# Patient Record
Sex: Male | Born: 1985 | Race: Black or African American | Hispanic: No | Marital: Married | Smoking: Current every day smoker
Health system: Southern US, Community
[De-identification: ages and names within clinical notes are randomized; demographics above are authoritative.]

---

## 2020-05-06 ENCOUNTER — Encounter (HOSPITAL_COMMUNITY): Payer: Self-pay

## 2020-05-06 ENCOUNTER — Other Ambulatory Visit: Payer: Self-pay

## 2020-05-06 ENCOUNTER — Emergency Department (HOSPITAL_COMMUNITY): Payer: Self-pay

## 2020-05-06 DIAGNOSIS — R0602 Shortness of breath: Secondary | ICD-10-CM | POA: Insufficient documentation

## 2020-05-06 DIAGNOSIS — Z20822 Contact with and (suspected) exposure to covid-19: Secondary | ICD-10-CM | POA: Insufficient documentation

## 2020-05-06 DIAGNOSIS — R079 Chest pain, unspecified: Secondary | ICD-10-CM | POA: Insufficient documentation

## 2020-05-06 DIAGNOSIS — R05 Cough: Secondary | ICD-10-CM | POA: Insufficient documentation

## 2020-05-06 MED ORDER — SODIUM CHLORIDE 0.9% FLUSH: 3.0000 mL | Freq: Once | INTRAVENOUS | Status: DC

## 2020-05-06 NOTE — ED Triage Notes (Addendum)
Pt reports R sided chest pain with deep insipration, runny nose, joint pain, fatigue and a sore in his mouth that started yesterday. States that he has not been feeling himself. Pt reports that he works around a lot of silica dust and also recently started vaping. A&ox4. Ambulatory,

## 2020-05-07 ENCOUNTER — Emergency Department (HOSPITAL_COMMUNITY)
Admission: EM | Admit: 2020-05-07 | Discharge: 2020-05-07 | Disposition: A | Discharge status: Home / Self Care | Payer: Self-pay | Attending: Emergency Medicine | Admitting: Emergency Medicine

## 2020-05-07 DIAGNOSIS — R079 Chest pain, unspecified: Secondary | ICD-10-CM

## 2020-05-07 LAB — BASIC METABOLIC PANEL
Anion gap: 10 (ref 5–15)
BUN: 13 mg/dL (ref 6–20)
CO2: 25 mmol/L (ref 22–32)
Calcium: 9.1 mg/dL (ref 8.9–10.3)
Chloride: 107 mmol/L (ref 98–111)
Creatinine, Ser: 0.78 mg/dL (ref 0.61–1.24)
GFR calc Af Amer: >60 mL/min (ref >60)
GFR calc non Af Amer: >60 mL/min (ref >60)
Glucose, Bld: 115 mg/dL — ABNORMAL HIGH (ref 70–99)
Potassium: 3.9 mmol/L (ref 3.5–5.1)
Sodium: 142 mmol/L (ref 135–145)

## 2020-05-07 LAB — CBC
HCT: 43.2 % (ref 39.0–52.0)
Hemoglobin: 14.3 g/dL (ref 13.0–17.0)
MCH: 29.9 pg (ref 26.0–34.0)
MCHC: 33.1 g/dL (ref 30.0–36.0)
MCV: 90.2 fL (ref 80.0–100.0)
Platelets: 173 10*3/uL (ref 150–400)
RBC: 4.79 MIL/uL (ref 4.22–5.81)
RDW: 14.1 % (ref 11.5–15.5)
WBC: 11.5 10*3/uL — ABNORMAL HIGH (ref 4.0–10.5)
nRBC: 0 % (ref 0.0–0.2)

## 2020-05-07 LAB — SARS CORONAVIRUS 2 BY RT PCR (HOSPITAL ORDER, PERFORMED IN ~~LOC~~ HOSPITAL LAB): SARS Coronavirus 2: NEGATIVE

## 2020-05-07 LAB — TROPONIN I (HIGH SENSITIVITY): Troponin I (High Sensitivity): 3 ng/L (ref <18)

## 2020-05-07 MED ORDER — IPRATROPIUM-ALBUTEROL 0.5-2.5 (3) MG/3ML IN SOLN
3.0000 mL | Freq: Once | RESPIRATORY_TRACT | Status: AC
  Administered 2020-05-07: 3 mL via RESPIRATORY_TRACT
  Filled 2020-05-07: qty 3

## 2020-05-07 MED ORDER — ALBUTEROL SULFATE HFA 108 (90 BASE) MCG/ACT IN AERS
2.0000 | INHALATION_SPRAY | Freq: Once | RESPIRATORY_TRACT | Status: AC
  Administered 2020-05-07: 2 via RESPIRATORY_TRACT
  Filled 2020-05-07: qty 6.7

## 2020-05-07 NOTE — Discharge Instructions (Addendum)
You are seen today for chest pain.  I want you to use the albuterol, 2 puffs once in the morning and once at night for the next week.  I want you to follow-up with your primary care if this does not resolve the next couple of days.  Please wear a mask at work.  I also want you to stop vaping and using tobacco products.  Come back to the emergency department if you have any new or worsening concerning symptoms. Use the attached guides.

## 2020-05-07 NOTE — ED Provider Notes (Signed)
[facility]-EMERGENCY DEPT Provider Note   CSN: [id number] Arrival date & time: 05/06/20  2300     History Chief Complaint  Patient presents with  . Chest Pain    Jared [name] is a 34 y.o. male with no significant past medical history who presents the emergency department with a chief complaint of chest pain.  The patient reports that he developed right-sided chest pain, characterized as pressure and tightness, that has been constant for the last 2 days and is accompanied by dyspnea on exertion, cough, fatigue, rhinorrhea, itchy, watery eyes.  Chest pain is pleuritic.  The patient states that he also noticed a sore to the inside of his left cheek yesterday.  His cough is nonproductive and he notes that it is worse while he is working.  Reports that he began a new job approximately 3 weeks ago where he is exposed to a large amount of silica dust.  He has not supplied with a mask at his job.    He denies fever, chills, abdominal pain, nausea, vomiting, diarrhea, palpitations, leg swelling, back pain, flank pain, dizziness, lightheadedness, headache.  He also notes that he recently began vaping tobacco.  He denies any other illicit or recreational substance use.  No recent travel.  Personal or familial history of PE.  No recent falls or injuries.  He has not been vaccinated for COVID-19.  No sick contacts.  No history of asthma, but he does report a family history of asthma.  The history is provided by the patient. No language interpreter was used.       History reviewed. No pertinent past medical history.  There are no problems to display for this patient.    History reviewed. No pertinent family history.  Social History   Tobacco Use  . Smoking status: Not on file  Substance Use Topics  . Alcohol use: Not on file  . Drug use: Not on file    Home Medications Prior to Admission medications   Not on File    Allergies    Patient has no known  allergies.  Review of Systems   Review of Systems  Constitutional: Negative for appetite change, chills, diaphoresis and fever.  HENT: Positive for rhinorrhea. Negative for congestion, ear discharge, ear pain, facial swelling, sinus pressure, sore throat and voice change.   Eyes: Positive for discharge and itching. Negative for visual disturbance.  Respiratory: Positive for cough and shortness of breath. Negative for apnea and chest tightness.   Cardiovascular: Negative for chest pain, palpitations and leg swelling.  Gastrointestinal: Negative for abdominal pain, blood in stool, diarrhea, nausea and vomiting.  Genitourinary: Negative for dysuria and hematuria.  Musculoskeletal: Negative for back pain, gait problem, myalgias, neck pain and neck stiffness.  Skin: Negative for color change and rash.  Allergic/Immunologic: Negative for immunocompromised state.  Neurological: Negative for seizures, syncope, weakness, numbness and headaches.  Psychiatric/Behavioral: Negative for confusion.    Physical Exam Updated Vital Signs BP (!) 144/106   Pulse 71   Temp 97.7 F (36.5 C) (Oral)   Resp 17   Ht 5\' 8"  (1.727 m)   Wt 93.9 kg   SpO2 100%   BMI 31.47 kg/m   Physical Exam Vitals and nursing note reviewed.  Constitutional:      General: He is not in acute distress.    Appearance: He is well-developed. He is not ill-appearing, toxic-appearing or diaphoretic.     Comments: Well-appearing.  No acute distress.  HENT:  Head: Normocephalic.     Nose: Rhinorrhea present. No congestion.     Mouth/Throat:     Mouth: Mucous membranes are moist.     Comments: Small ulcerated lesion noted to the left buccal mucosa.  No pustules, bleeding, or surrounding erythema.  No palatal petechiae.  Posterior oropharynx is unremarkable. Eyes:     General:        Right eye: No discharge.        Left eye: No discharge.     Extraocular Movements: Extraocular movements intact.     Conjunctiva/sclera:  Conjunctivae normal.     Pupils: Pupils are equal, round, and reactive to light.  Cardiovascular:     Rate and Rhythm: Normal rate and regular rhythm.     Pulses: Normal pulses.     Heart sounds: Normal heart sounds. No murmur heard.  No friction rub. No gallop.   Pulmonary:     Effort: Pulmonary effort is normal. No respiratory distress.     Breath sounds: Normal breath sounds. No stridor. No wheezing, rhonchi or rales.     Comments: No reproducible chest pain to the chest wall.  No crepitus.  No step-offs.  Lungs are clear to auscultation bilaterally.  No increased work of breathing including retractions, accessory muscle use, or nasal flaring. Chest:     Chest wall: No tenderness.  Abdominal:     General: There is no distension.     Palpations: Abdomen is soft. There is no mass.     Tenderness: There is no abdominal tenderness. There is no right CVA tenderness, left CVA tenderness, guarding or rebound.     Hernia: No hernia is present.  Musculoskeletal:     Cervical back: Normal range of motion and neck supple.     Right lower leg: No edema.     Left lower leg: No edema.  Skin:    General: Skin is warm and dry.     Capillary Refill: Capillary refill takes less than 2 seconds.     Coloration: Skin is not jaundiced or pale.     Findings: No bruising or erythema.  Neurological:     Mental Status: He is alert.  Psychiatric:        Behavior: Behavior normal.     ED Results / Procedures / Treatments   Labs (all labs ordered are listed, but only abnormal results are displayed) Labs Reviewed  BASIC METABOLIC PANEL - Abnormal; Notable for the following components:      Result Value   Glucose, Bld 115 (*)    All other components within normal limits  CBC - Abnormal; Notable for the following components:   WBC 11.5 (*)    All other components within normal limits  SARS CORONAVIRUS 2 BY RT PCR (HOSPITAL ORDER, PERFORMED IN [facility])  TROPONIN I (HIGH  SENSITIVITY)  TROPONIN I (HIGH SENSITIVITY)    EKG None   Date: 05/07/2020  Rate: 70  Rhythm: normal sinus rhythm  QRS Axis: normal  Intervals: normal  ST/T Wave abnormalities: normal  Conduction Disutrbances: none  Narrative Interpretation:   Old EKG Reviewed: No previous EKG available for comparison.   Radiology DG Chest 2 View  Result Date: 05/06/2020 CLINICAL DATA:  Chest pain. EXAM: CHEST - 2 VIEW COMPARISON:  None. FINDINGS: The heart size and mediastinal contours are within normal limits. Both lungs are clear. The visualized skeletal structures are unremarkable. IMPRESSION: No active cardiopulmonary disease. Electronically Signed   By: Demetrius Revel.D.  On: 05/06/2020 23:46    Procedures Procedures (including critical care time)  Medications Ordered in ED Medications  sodium chloride flush (NS) 0.9 % injection 3 mL (has no administration in time range)  ipratropium-albuterol (DUONEB) 0.5-2.5 (3) MG/3ML nebulizer solution 3 mL (3 mLs Nebulization Given 05/07/20 2297)    ED Course  I have reviewed the triage vital signs and the nursing notes.  Pertinent labs & imaging results that were available during my care of the patient were reviewed by me and considered in my medical decision making (see chart for details).    MDM Rules/Calculators/A&P                          34 year old male with no significant past medical history who recently began vaping tobacco and it began a new job approximately 3 weeks ago with significant silica dust exposure.  He has not been provided with a mask at his job nor has he been wearing his own mask.  He presents with 2 days of right-sided pleuritic chest pain that has been constant accompanied by dyspnea on exertion, nonproductive cough that is worse during his shifts at work, itchy watery eyes, and rhinorrhea.  No constitutional symptoms.  Vital signs are normal.  On exam, he is overall well-appearing and has no evidence of distress.   Lungs are notably clear to auscultation bilaterally and he has no increased work of breathing on my exam.  He does have a small ulcerated lesion noted to the left buccal mucosa.  This could be related to viral URI as he is also having rhinorrhea and itchy watery eyes.  He also has notably not received his COVID-19 test, which I will order here.  Chest x-ray is clear.  EKG with normal sinus rhythm.  Initial troponin was not elevated and given his hear score of 1, repeat troponin is not indicated.  Doubt ACS, esophageal rupture, aortic dissection, or tension pneumothorax.  No electrolyte derangements.  He has a slight leukocytosis, but has had no constitutional symptoms.  Suspect viral etiology versus symptoms related versus reactive airways disease versus silicosis.  We will treat his symptoms with DuoNeb and reassess.  This may need repeated depending on improvement after initial treatment.  We will plan to ambulate the patient on pulse ox in the ER.  We will also order COVID-19 test although less likely given his symptoms and presentation.  Also encouraged tobacco cessation and having the patient wear a mask during all shifts at his job.  Patient care transferred to PA Patel at the end of my shift to re-evaluate patient following albuterol nebulizer. Patient presentation, ED course, and plan of care discussed with review of all pertinent labs and imaging. Please see his/her note for further details regarding further ED course and disposition.     Final Clinical Impression(s) / ED Diagnoses Final diagnoses:  None    Rx / DC Orders ED Discharge Orders    None       Layloni Fahrner A, PA-C 05/07/20 0717    Molpus, Jonny Ruiz, MD 05/07/20 [phone]

## 2020-05-07 NOTE — ED Provider Notes (Signed)
°  Care of the patient was assumed from M. Mcdonald PA-C at 700 see this provider's note for complete history of present illness, review of systems, and physical exam.  Briefly, the patient is a 34 y.o. male who presented to the ED with pleuritic chest pain.   Plan at time of handoff:  Reassess after DuoNeb.  Patient to be discharged.     Physical Exam  BP (!) 144/106    Pulse 71    Temp 97.7 F (36.5 C) (Oral)    Resp 17    Ht 5\' 8"  (1.727 m)    Wt 93.9 kg    SpO2 100%    BMI 31.47 kg/m   Physical Exam Constitutional:      General: He is not in acute distress.    Appearance: Normal appearance. He is not ill-appearing, toxic-appearing or diaphoretic.  Cardiovascular:     Rate and Rhythm: Normal rate and regular rhythm.     Pulses: Normal pulses.  Pulmonary:     Effort: Pulmonary effort is normal. No respiratory distress.     Breath sounds: Normal breath sounds. No stridor. No wheezing, rhonchi or rales.  Musculoskeletal:        General: Normal range of motion.  Skin:    General: Skin is warm and dry.     Capillary Refill: Capillary refill takes less than 2 seconds.  Neurological:     General: No focal deficit present.     Mental Status: He is alert and oriented to person, place, and time.  Psychiatric:        Mood and Affect: Mood normal.        Behavior: Behavior normal.        Thought Content: Thought content normal.     ED Course/Procedures     Procedures  MDM  Patient is a 34 year old male that came in for pleuritic right-sided chest pain.  Also associated cough, rhinorrhea, itchy and watery eyes.  Normal vital signs, no evidence of distress.  Normal lung exam.  Chest x-ray clear.  EKG with normal sinus rhythm, initial troponin was negative.  HEAR score of 1.  PERC negative.  Previous provider did not think this was due to ACS.  This was most likely due to viral etiology versus symptoms of reactive airway disease versus silicosis.  Patient recently started new job,  states that he only has cough at work.  Patient states that he is exposed to large amounts of silica dust.  Does not have access to mask.  This also could be due to patient's recent initiation of vaping.  Covid test negative.  After DuoNeb, patient states that his chest pain feels better.  States that he still has chest pain, will give albuterol once more since patient states that it did make him feel better.  Will send patient home at this.  Discussed that patient should stop vaping.  Also discussed usage of masks at work, patient agreeable at this time.  Patient to follow-up with primary care if this is not resolved in a couple days.  Patient was able to walk in the ER without any difficulties.  Patient agreeable with this plan.  Patient to be discharged.          20, PA-C 05/07/20 07/08/20    [id number], MD 05/08/20 [phone]

## 2020-05-07 NOTE — ED Notes (Signed)
Pt ambulated around department O2 maintaining between 96-100%.

## 2021-09-30 ENCOUNTER — Other Ambulatory Visit: Payer: Self-pay

## 2021-09-30 ENCOUNTER — Emergency Department (HOSPITAL_COMMUNITY)
Admission: EM | Admit: 2021-09-30 | Discharge: 2021-09-30 | Disposition: A | Discharge status: Home / Self Care | Payer: Self-pay | Attending: Emergency Medicine | Admitting: Emergency Medicine

## 2021-09-30 ENCOUNTER — Emergency Department (HOSPITAL_COMMUNITY): Payer: Self-pay

## 2021-09-30 ENCOUNTER — Encounter (HOSPITAL_COMMUNITY): Payer: Self-pay

## 2021-09-30 DIAGNOSIS — S20222A Contusion of left back wall of thorax, initial encounter: Secondary | ICD-10-CM | POA: Insufficient documentation

## 2021-09-30 DIAGNOSIS — R1032 Left lower quadrant pain: Secondary | ICD-10-CM | POA: Insufficient documentation

## 2021-09-30 DIAGNOSIS — R000 Tachycardia, unspecified: Secondary | ICD-10-CM | POA: Insufficient documentation

## 2021-09-30 DIAGNOSIS — F17210 Nicotine dependence, cigarettes, uncomplicated: Secondary | ICD-10-CM | POA: Insufficient documentation

## 2021-09-30 LAB — CBC WITH DIFFERENTIAL/PLATELET
Abs Immature Granulocytes: 0.1 10*3/uL — ABNORMAL HIGH (ref 0.00–0.07)
Basophils Absolute: 0.1 10*3/uL (ref 0.0–0.1)
Basophils Relative: 0 %
Eosinophils Absolute: 0 10*3/uL (ref 0.0–0.5)
Eosinophils Relative: 0 %
HCT: 48.5 % (ref 39.0–52.0)
Hemoglobin: 16 g/dL (ref 13.0–17.0)
Immature Granulocytes: 1 %
Lymphocytes Relative: 8 %
Lymphs Abs: 1.4 10*3/uL (ref 0.7–4.0)
MCH: 29.7 pg (ref 26.0–34.0)
MCHC: 33 g/dL (ref 30.0–36.0)
MCV: 90 fL (ref 80.0–100.0)
Monocytes Absolute: 1 10*3/uL (ref 0.1–1.0)
Monocytes Relative: 6 %
Neutro Abs: 14.9 10*3/uL — ABNORMAL HIGH (ref 1.7–7.7)
Neutrophils Relative %: 85 %
Platelets: 289 10*3/uL (ref 150–400)
RBC: 5.39 MIL/uL (ref 4.22–5.81)
RDW: 14 % (ref 11.5–15.5)
WBC: 17.5 10*3/uL — ABNORMAL HIGH (ref 4.0–10.5)
nRBC: 0 % (ref 0.0–0.2)

## 2021-09-30 LAB — COMPREHENSIVE METABOLIC PANEL
ALT: 17 U/L (ref 0–44)
AST: 26 U/L (ref 15–41)
Albumin: 4.5 g/dL (ref 3.5–5.0)
Alkaline Phosphatase: 114 U/L (ref 38–126)
Anion gap: 8 (ref 5–15)
BUN: 12 mg/dL (ref 6–20)
CO2: 29 mmol/L (ref 22–32)
Calcium: 8.9 mg/dL (ref 8.9–10.3)
Chloride: 104 mmol/L (ref 98–111)
Creatinine, Ser: 0.93 mg/dL (ref 0.61–1.24)
GFR, Estimated: >60 mL/min (ref >60)
Glucose, Bld: 106 mg/dL — ABNORMAL HIGH (ref 70–99)
Potassium: 3.5 mmol/L (ref 3.5–5.1)
Sodium: 141 mmol/L (ref 135–145)
Total Bilirubin: 0.4 mg/dL (ref 0.3–1.2)
Total Protein: 8.4 g/dL — ABNORMAL HIGH (ref 6.5–8.1)

## 2021-09-30 MED ORDER — IOHEXOL 350 MG/ML SOLN
80.0000 mL | Freq: Once | INTRAVENOUS | Status: AC | PRN
  Administered 2021-09-30: 80 mL via INTRAVENOUS

## 2021-09-30 MED ORDER — SODIUM CHLORIDE (PF) 0.9 % IJ SOLN
INTRAMUSCULAR | Status: AC
  Filled 2021-09-30: qty 50

## 2021-09-30 MED ORDER — HYDROCODONE-ACETAMINOPHEN 5-325 MG PO TABS
1.0000 | ORAL_TABLET | Freq: Once | ORAL | Status: AC
  Administered 2021-09-30: 1 via ORAL
  Filled 2021-09-30: qty 1

## 2021-09-30 MED ORDER — CYCLOBENZAPRINE HCL 10 MG PO TABS
10.0000 mg | ORAL_TABLET | Freq: Two times a day (BID) | ORAL | 0 refills | Status: AC | PRN
Start: 2021-09-30 — End: ?
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
## 2021-09-30 NOTE — ED Triage Notes (Signed)
20G IV placed in RAC for CT scan with permission from Keenan Bachelor, RN. Placed back in lobby. Instructed not to leave with IV in place. Pt verbalized understanding.

## 2021-10-12 ENCOUNTER — Other Ambulatory Visit: Payer: Self-pay

## 2021-10-12 ENCOUNTER — Encounter (HOSPITAL_COMMUNITY): Payer: Self-pay

## 2021-10-12 ENCOUNTER — Emergency Department (HOSPITAL_COMMUNITY)
Admission: EM | Admit: 2021-10-12 | Discharge: 2021-10-12 | Disposition: A | Discharge status: Home / Self Care | Payer: Self-pay | Attending: Student | Admitting: Student

## 2021-10-12 ENCOUNTER — Emergency Department (HOSPITAL_COMMUNITY): Payer: Self-pay

## 2021-10-12 DIAGNOSIS — S32019A Unspecified fracture of first lumbar vertebra, initial encounter for closed fracture: Secondary | ICD-10-CM | POA: Insufficient documentation

## 2021-10-12 DIAGNOSIS — S32009A Unspecified fracture of unspecified lumbar vertebra, initial encounter for closed fracture: Secondary | ICD-10-CM

## 2021-10-12 DIAGNOSIS — S2232XA Fracture of one rib, left side, initial encounter for closed fracture: Secondary | ICD-10-CM | POA: Insufficient documentation

## 2021-10-12 DIAGNOSIS — F17210 Nicotine dependence, cigarettes, uncomplicated: Secondary | ICD-10-CM | POA: Insufficient documentation

## 2021-10-12 MED ORDER — LIDOCAINE 5 % EX PTCH: 1.0000 | MEDICATED_PATCH | CUTANEOUS | 0 refills | Status: AC

## 2021-10-12 MED ORDER — HYDROCODONE-ACETAMINOPHEN 5-325 MG PO TABS
1.0000 | ORAL_TABLET | Freq: Once | ORAL | Status: AC
  Administered 2021-10-12: 1 via ORAL
  Filled 2021-10-12: qty 1

## 2021-10-12 MED ORDER — NAPROXEN 500 MG PO TABS: 500.0000 mg | ORAL_TABLET | Freq: Two times a day (BID) | ORAL | 0 refills | Status: AC

## 2021-10-12 NOTE — Progress Notes (Signed)
Orthopedic Tech Progress Note Patient Details:  Jared [name] 1986/08/28 [id number]  Patient ID: [name], male   DOB: Jul 24, 1986, 35 y.o.   MRN: [id number]  Kizzie Fantasia 10/12/2021, 9:20 AM TLSO ordered from [facility]
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
## 2021-10-12 NOTE — ED Notes (Signed)
Ortho tech called for TLSO brace. Informed this brace comes from an outside vendor and it will take about an hr.
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
## 2021-10-12 NOTE — Discharge Instructions (Addendum)
Your x-ray today revealed a fracture of your 11th rib on the left side as well as a fracture in your spine at the level of L1.  This also explains your leg discomfort.  Please follow-up with the orthopedic office attached to these discharge papers as possible.  I have attached a work note for you as well.  I have sent lidocaine patches and naproxen to your pharmacy.  You may take the naproxen in the morning and at night.  As far as the lidocaine patches, you may use up to 3 patches at the same time however you must remember to wear these patches for no more than 12 hours at a time.  This is important.

## 2023-03-22 IMAGING — CR DG LUMBAR SPINE COMPLETE 4+V
5 series · 5 of 5 positions shown · non-contrast
Comparison: CT 09/30/2021

CLINICAL DATA: Back pain after injury 2 weeks ago

EXAM:
LUMBAR SPINE - COMPLETE 4+ VIEW

[t lumbar spine ap]
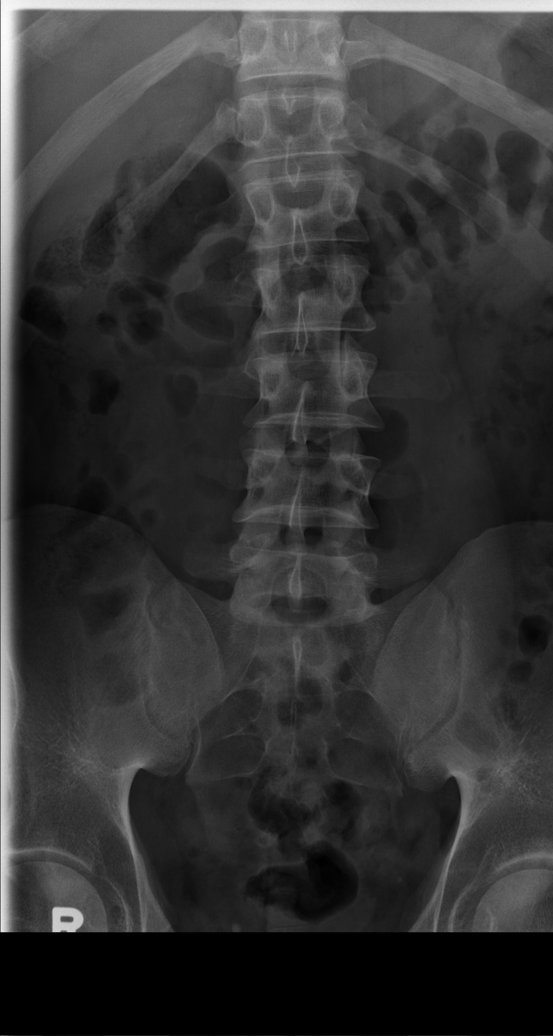

[t lumbar spine obl (1 of 2)]
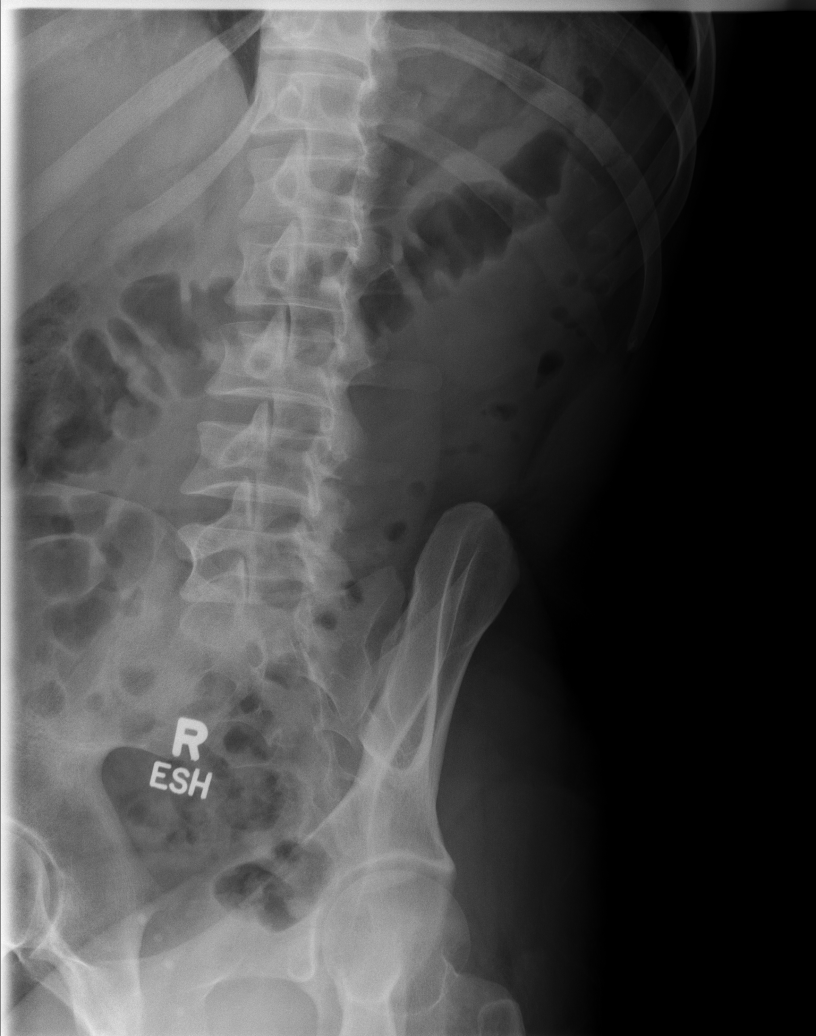

[t lumbar spine obl (2 of 2)]
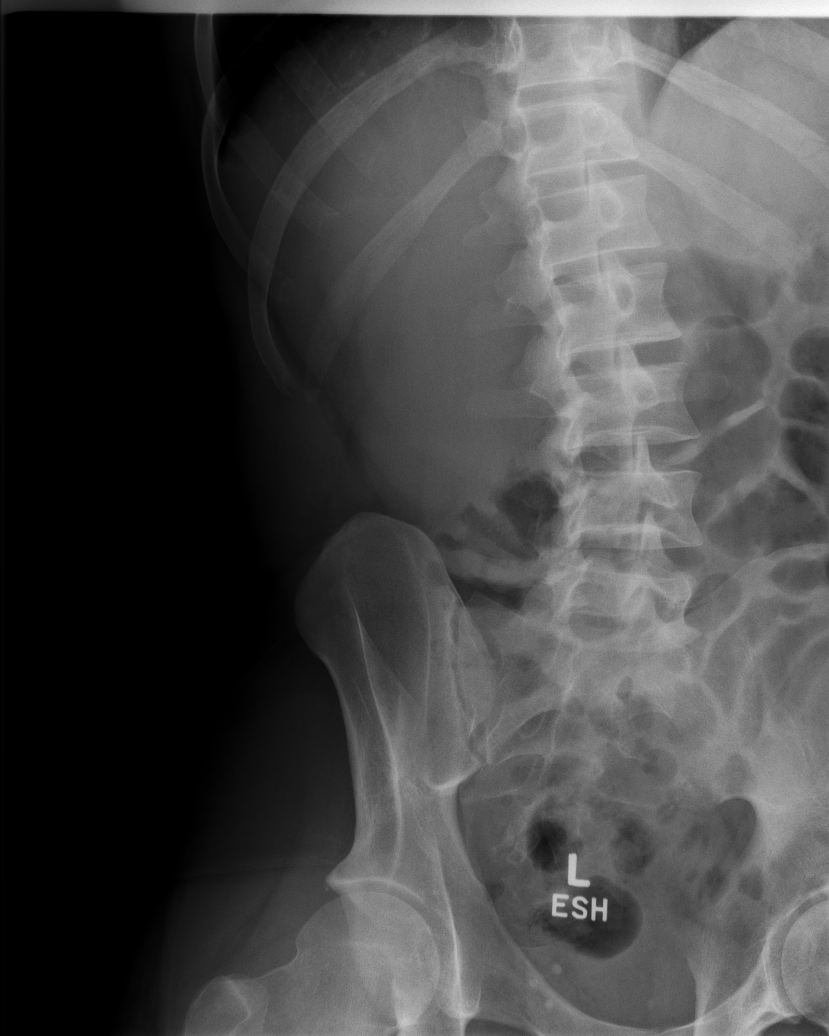

[t lumbar spine lat]
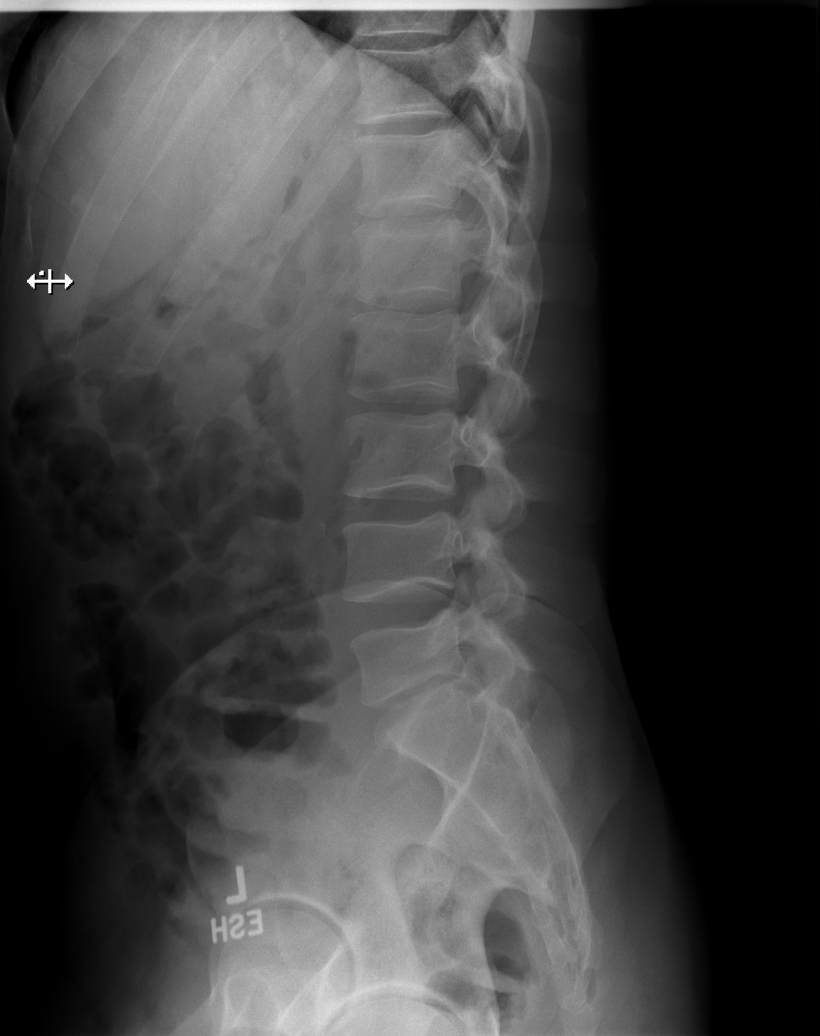

[t lumbar l-5 s-1 spot]
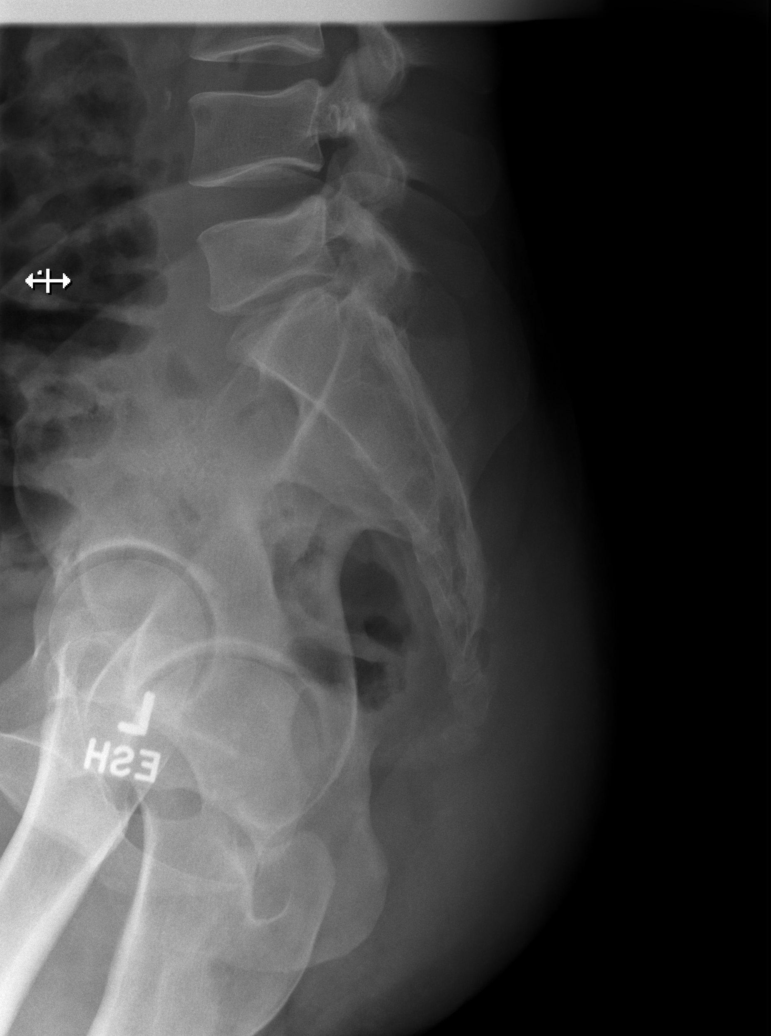

[5 of 5 positions shown; findings below may reference images not displayed]

FINDINGS: Nondisplaced fracture of the left L1 transverse process.
Nondisplaced fracture of the posterior left eleventh rib. Vertebral
body heights are maintained without fracture. Mild intervertebral
disc height loss of L5-S1.
IMPRESSION: 1. Acute nondisplaced fracture of the left L1 transverse process.
2. Acute nondisplaced posterior left eleventh rib fracture.
